# Patient Record
Sex: Male | Born: 1995 | Race: White | Hispanic: No | Marital: Single | Smoking: Current every day smoker
Health system: Southern US, Community
[De-identification: ages and names within clinical notes are randomized; demographics above are authoritative.]

---

## 2014-10-11 ENCOUNTER — Emergency Department: Payer: BLUE CROSS/BLUE SHIELD

## 2014-10-11 ENCOUNTER — Emergency Department
Admission: EM | Admit: 2014-10-11 | Discharge: 2014-10-11 | Disposition: A | Discharge status: Home / Self Care | Payer: BLUE CROSS/BLUE SHIELD | Attending: Emergency Medicine | Admitting: Emergency Medicine

## 2014-10-11 DIAGNOSIS — Y9389 Activity, other specified: Secondary | ICD-10-CM | POA: Insufficient documentation

## 2014-10-11 DIAGNOSIS — Y9289 Other specified places as the place of occurrence of the external cause: Secondary | ICD-10-CM | POA: Insufficient documentation

## 2014-10-11 DIAGNOSIS — W228XXA Striking against or struck by other objects, initial encounter: Secondary | ICD-10-CM | POA: Insufficient documentation

## 2014-10-11 DIAGNOSIS — S0990XA Unspecified injury of head, initial encounter: Secondary | ICD-10-CM | POA: Diagnosis present

## 2014-10-11 DIAGNOSIS — S060X0A Concussion without loss of consciousness, initial encounter: Secondary | ICD-10-CM | POA: Diagnosis not present

## 2014-10-11 DIAGNOSIS — Z880 Allergy status to penicillin: Secondary | ICD-10-CM | POA: Diagnosis not present

## 2014-10-11 DIAGNOSIS — Z720 Tobacco use: Secondary | ICD-10-CM | POA: Insufficient documentation

## 2014-10-11 DIAGNOSIS — Y998 Other external cause status: Secondary | ICD-10-CM | POA: Insufficient documentation

## 2014-10-11 NOTE — Discharge Instructions (Signed)
Please seek medical attention for any high fevers, chest pain, shortness of breath, change in behavior, persistent vomiting, bloody stool or any other new or concerning symptoms.   Concussion A concussion, or closed-head injury, is a brain injury caused by a direct blow to the head or by a quick and sudden movement (jolt) of the head or neck. Concussions are usually not life-threatening. Even so, the effects of a concussion can be serious. If you have had a concussion before, you are more likely to experience concussion-like symptoms after a direct blow to the head.  CAUSES  Direct blow to the head, such as from running into another player during a soccer game, being hit in a fight, or hitting your head on a hard surface.  A jolt of the head or neck that causes the brain to move back and forth inside the skull, such as in a car crash. SIGNS AND SYMPTOMS The signs of a concussion can be hard to notice. Early on, they may be missed by you, family members, and health care providers. You may look fine but act or feel differently. Symptoms are usually temporary, but they may last for days, weeks, or even longer. Some symptoms may appear right away while others may not show up for hours or days. Every head injury is different. Symptoms include:  Mild to moderate headaches that will not go away.  A feeling of pressure inside your head.  Having more trouble than usual:  Learning or remembering things you have heard.  Answering questions.  Paying attention or concentrating.  Organizing daily tasks.  Making decisions and solving problems.  Slowness in thinking, acting or reacting, speaking, or reading.  Getting lost or being easily confused.  Feeling tired all the time or lacking energy (fatigued).  Feeling drowsy.  Sleep disturbances.  Sleeping more than usual.  Sleeping less than usual.  Trouble falling asleep.  Trouble sleeping (insomnia).  Loss of balance or feeling  lightheaded or dizzy.  Nausea or vomiting.  Numbness or tingling.  Increased sensitivity to:  Sounds.  Lights.  Distractions.  Vision problems or eyes that tire easily.  Diminished sense of taste or smell.  Ringing in the ears.  Mood changes such as feeling sad or anxious.  Becoming easily irritated or angry for little or no reason.  Lack of motivation.  Seeing or hearing things other people do not see or hear (hallucinations). DIAGNOSIS Your health care provider can usually diagnose a concussion based on a description of your injury and symptoms. He or she will ask whether you passed out (lost consciousness) and whether you are having trouble remembering events that happened right before and during your injury. Your evaluation might include:  A brain scan to look for signs of injury to the brain. Even if the test shows no injury, you may still have a concussion.  Blood tests to be sure other problems are not present. TREATMENT  Concussions are usually treated in an emergency department, in urgent care, or at a clinic. You may need to stay in the hospital overnight for further treatment.  Tell your health care provider if you are taking any medicines, including prescription medicines, over-the-counter medicines, and natural remedies. Some medicines, such as blood thinners (anticoagulants) and aspirin, may increase the chance of complications. Also tell your health care provider whether you have had alcohol or are taking illegal drugs. This information may affect treatment.  Your health care provider will send you home with important instructions to follow.  How fast you will recover from a concussion depends on many factors. These factors include how severe your concussion is, what part of your brain was injured, your age, and how healthy you were before the concussion.  Most people with mild injuries recover fully. Recovery can take time. In general, recovery is slower in  older persons. Also, persons who have had a concussion in the past or have other medical problems may find that it takes longer to recover from their current injury. HOME CARE INSTRUCTIONS General Instructions  Carefully follow the directions your health care provider gave you.  Only take over-the-counter or prescription medicines for pain, discomfort, or fever as directed by your health care provider.  Take only those medicines that your health care provider has approved.  Do not drink alcohol until your health care provider says you are well enough to do so. Alcohol and certain other drugs may slow your recovery and can put you at risk of further injury.  If it is harder than usual to remember things, write them down.  If you are easily distracted, try to do one thing at a time. For example, do not try to watch TV while fixing dinner.  Talk with family members or close friends when making important decisions.  Keep all follow-up appointments. Repeated evaluation of your symptoms is recommended for your recovery.  Watch your symptoms and tell others to do the same. Complications sometimes occur after a concussion. Older adults with a brain injury may have a higher risk of serious complications, such as a blood clot on the brain.  Tell your teachers, school nurse, school counselor, coach, athletic trainer, or work Production designer, theatre/television/film about your injury, symptoms, and restrictions. Tell them about what you can or cannot do. They should watch for:  Increased problems with attention or concentration.  Increased difficulty remembering or learning new information.  Increased time needed to complete tasks or assignments.  Increased irritability or decreased ability to cope with stress.  Increased symptoms.  Rest. Rest helps the brain to heal. Make sure you:  Get plenty of sleep at night. Avoid staying up late at night.  Keep the same bedtime hours on weekends and weekdays.  Rest during the day.  Take daytime naps or rest breaks when you feel tired.  Limit activities that require a lot of thought or concentration. These include:  Doing homework or job-related work.  Watching TV.  Working on the computer.  Avoid any situation where there is potential for another head injury (football, hockey, soccer, basketball, martial arts, downhill snow sports and horseback riding). Your condition will get worse every time you experience a concussion. You should avoid these activities until you are evaluated by the appropriate follow-up health care providers. Returning To Your Regular Activities You will need to return to your normal activities slowly, not all at once. You must give your body and brain enough time for recovery.  Do not return to sports or other athletic activities until your health care provider tells you it is safe to do so.  Ask your health care provider when you can drive, ride a bicycle, or operate heavy machinery. Your ability to react may be slower after a brain injury. Never do these activities if you are dizzy.  Ask your health care provider about when you can return to work or school. Preventing Another Concussion It is very important to avoid another brain injury, especially before you have recovered. In rare cases, another injury can lead to permanent  brain damage, brain swelling, or death. The risk of this is greatest during the first 7-10 days after a head injury. Avoid injuries by:  Wearing a seat belt when riding in a car.  Drinking alcohol only in moderation.  Wearing a helmet when biking, skiing, skateboarding, skating, or doing similar activities.  Avoiding activities that could lead to a second concussion, such as contact or recreational sports, until your health care provider says it is okay.  Taking safety measures in your home.  Remove clutter and tripping hazards from floors and stairways.  Use grab bars in bathrooms and handrails by stairs.  Place  non-slip mats on floors and in bathtubs.  Improve lighting in dim areas. SEEK MEDICAL CARE IF:  You have increased problems paying attention or concentrating.  You have increased difficulty remembering or learning new information.  You need more time to complete tasks or assignments than before.  You have increased irritability or decreased ability to cope with stress.  You have more symptoms than before. Seek medical care if you have any of the following symptoms for more than 2 weeks after your injury:  Lasting (chronic) headaches.  Dizziness or balance problems.  Nausea.  Vision problems.  Increased sensitivity to noise or light.  Depression or mood swings.  Anxiety or irritability.  Memory problems.  Difficulty concentrating or paying attention.  Sleep problems.  Feeling tired all the time. SEEK IMMEDIATE MEDICAL CARE IF:  You have severe or worsening headaches. These may be a sign of a blood clot in the brain.  You have weakness (even if only in one hand, leg, or part of the face).  You have numbness.  You have decreased coordination.  You vomit repeatedly.  You have increased sleepiness.  One pupil is larger than the other.  You have convulsions.  You have slurred speech.  You have increased confusion. This may be a sign of a blood clot in the brain.  You have increased restlessness, agitation, or irritability.  You are unable to recognize people or places.  You have neck pain.  It is difficult to wake you up.  You have unusual behavior changes.  You lose consciousness. MAKE SURE YOU:  Understand these instructions.  Will watch your condition.  Will get help right away if you are not doing well or get worse. Document Released: 03/23/2003 Document Revised: 01/05/2013 Document Reviewed: 07/23/2012 [facility] Patient Information 2015 [employer], [place]. This information is not intended to replace advice given to you by your health care  provider. Make sure you discuss any questions you have with your health care provider.

## 2014-10-11 NOTE — ED Provider Notes (Signed)
[facility] Emergency Department Provider Note    ____________________________________________  Time seen: 1525  I have reviewed the triage vital signs and the nursing notes.   HISTORY  Chief Complaint Head Injury   History limited by: Not Limited   HPI Gianlucca Szymborski is a 19 y.o. male who presents to the emergency department today because of concerns for head injury. The patient states that 3 days ago he attempted to do a back flip on a deck when he hit his head. He denies any loss of consciousness. He states that since that time he has felt off. He states he has had a hard time concentrating. He states that he has not felt as smart as normal. These symptoms have been constant. He denies any vomiting, or focal weakness/numbness.   History reviewed. No pertinent past medical history.  There are no active problems to display for this patient.   History reviewed. No pertinent past surgical history.  No current outpatient prescriptions on file.  Allergies Penicillins  No family history on file.  Social History Social History  Substance Use Topics  . Smoking status: Current Every Day Smoker    Types: Cigarettes  . Smokeless tobacco: Current User  . Alcohol Use: Yes    Review of Systems  Constitutional: Negative for fever. Cardiovascular: Negative for chest pain. Respiratory: Negative for shortness of breath. Gastrointestinal: Negative for abdominal pain, vomiting and diarrhea. Genitourinary: Negative for dysuria. Musculoskeletal: Negative for back pain. Skin: Negative for rash. Neurological: Negative for headaches, focal weakness or numbness.  10-point ROS otherwise negative.  ____________________________________________   PHYSICAL EXAM:  VITAL SIGNS: ED Triage Vitals  Enc Vitals Group     BP 10/11/14 1354 139/85 mmHg     Pulse Rate 10/11/14 1354 80     Resp 10/11/14 1354 17     Temp 10/11/14 1354 97.4 F (36.3 C)   Temp Source 10/11/14 1354 Oral     SpO2 10/11/14 1354 96 %     Weight 10/11/14 1354 145 lb (65.772 kg)     Height 10/11/14 1354  (1.803 m)   Constitutional: Alert and oriented. Well appearing and in no distress. Eyes: Conjunctivae are normal. PERRL. Normal extraocular movements. ENT   Head: Normocephalic and atraumatic.   Nose: No congestion/rhinnorhea.   Mouth/Throat: Mucous membranes are moist.   Neck: No stridor. Hematological/Lymphatic/Immunilogical: No cervical lymphadenopathy. Cardiovascular: Normal rate, regular rhythm.  No murmurs, rubs, or gallops. Respiratory: Normal respiratory effort without tachypnea nor retractions. Breath sounds are clear and equal bilaterally. No wheezes/rales/rhonchi. Gastrointestinal: Soft and nontender. No distention.  Genitourinary: Deferred Musculoskeletal: Normal range of motion in all extremities. No joint effusions.  No lower extremity tenderness nor edema. Neurologic:  Normal speech and language. No gross focal neurologic deficits are appreciated. Speech is normal.  Skin:  Skin is warm, dry and intact. No rash noted. Psychiatric: Mood and affect are normal. Speech and behavior are normal. Patient exhibits appropriate insight and judgment.  ____________________________________________    LABS (pertinent positives/negatives)  None  ____________________________________________   EKG  None  ____________________________________________    RADIOLOGY  CT head IMPRESSION: 1. No evidence for acute intracranial abnormality. 2. Changes of chronic sinusitis.   ____________________________________________   PROCEDURES  Procedure(s) performed: None  Critical Care performed: No  ____________________________________________   INITIAL IMPRESSION / ASSESSMENT AND PLAN / ED COURSE  Pertinent labs & imaging results that were available during my care of the patient were reviewed by me and considered in my  medical  decision making (see chart for details).  Patient presents to the emergency department today after a closed head injury. No loss of consciousness. CT head negative. Think likely patient suffered a concussion giving continued symptoms. Did discuss this with the patient. Will give him the next couple days off school and instructed patient to follow-up with student health.  ____________________________________________   FINAL CLINICAL IMPRESSION(S) / ED DIAGNOSES  Final diagnoses:  Concussion, without loss of consciousness, initial encounter     Phineas Semen, MD 10/11/14 1535

## 2014-10-11 NOTE — ED Notes (Signed)
Pt states he did a back flip on Friday or Saturday is unsure which day and landed on his head.the patient c/o feeling "spacey"..denies HA, blurred vision or other sx.,.

## 2017-05-27 IMAGING — CT CT HEAD W/O CM
1 of 2 series · 16 of 30 positions shown, 20 images · non-contrast
Comparison: None.

CLINICAL DATA: Pt states he did a back flip on [REDACTED] or [REDACTED]
is unsure which day and landed on his head.the patient c/o feeling
"Tecer"..denies HA, blurred vision or other sx.,. ? Concussion

EXAM:
CT HEAD WITHOUT CONTRAST
TECHNIQUE: Contiguous axial images were obtained from the base of the skull
through the vertex without intravenous contrast.

[Series 2: head wo · axial · 0.44mm/px · z∈[-38,+106]mm · 16 of 36 slices shown, 20 images]
[im 2/36  brain]
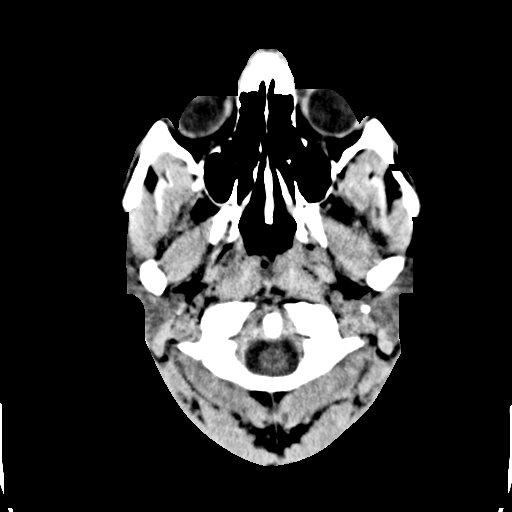
[im 2/36  bone]
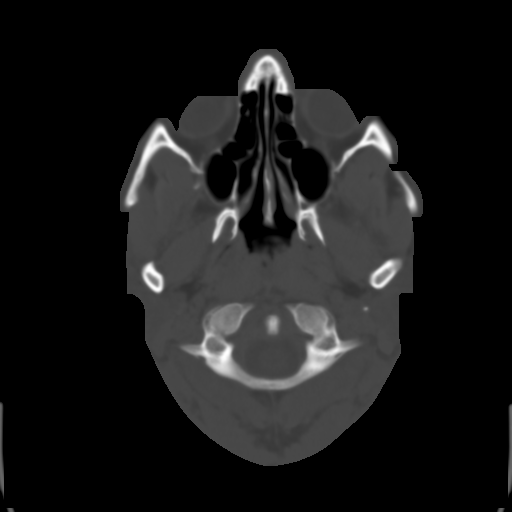
[im 4/36  brain]
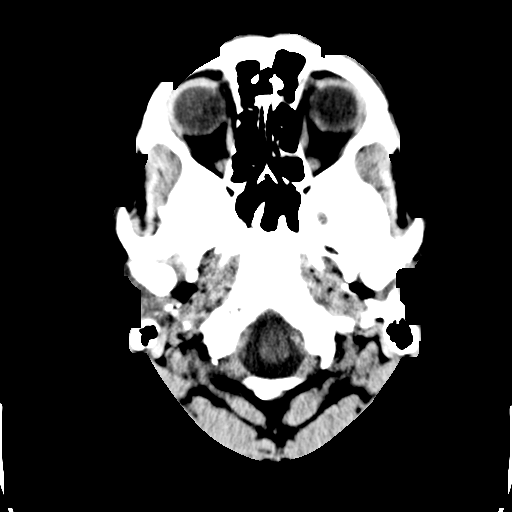
[im 6/36  brain]
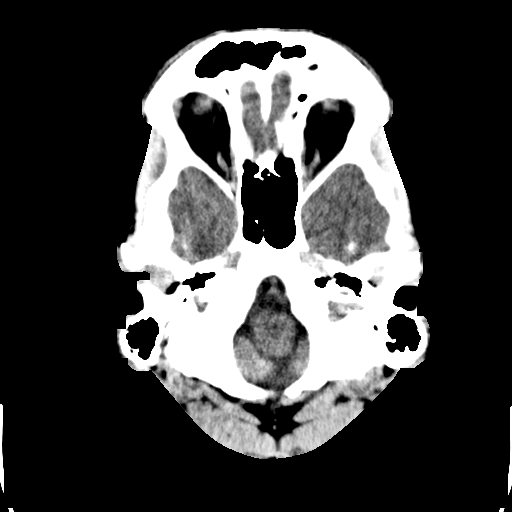
[im 9/36  brain]
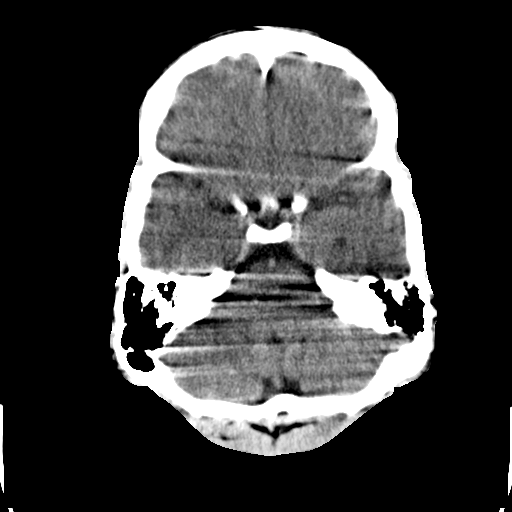
[im 11/36  brain]
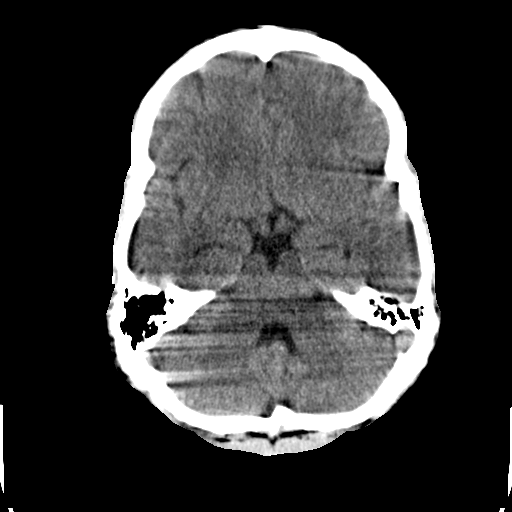
[im 11/36  bone]
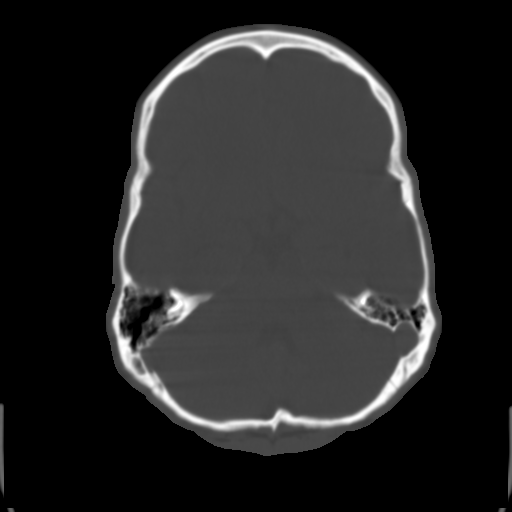
[im 13/36  brain]
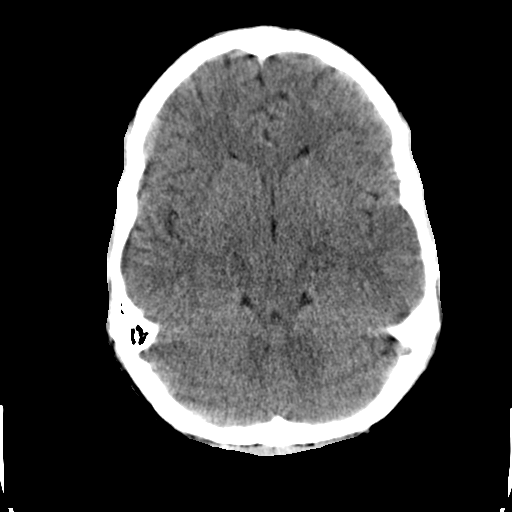
[im 15/36  brain]
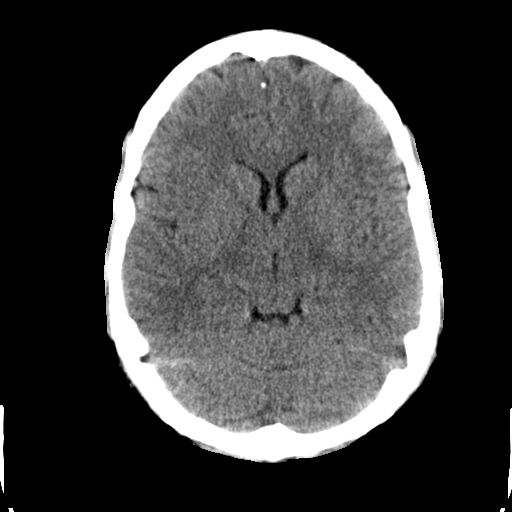
[im 16/36  brain]
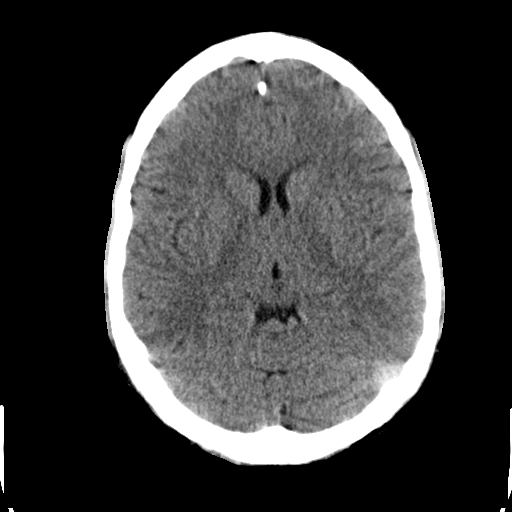
[im 20/36  brain]
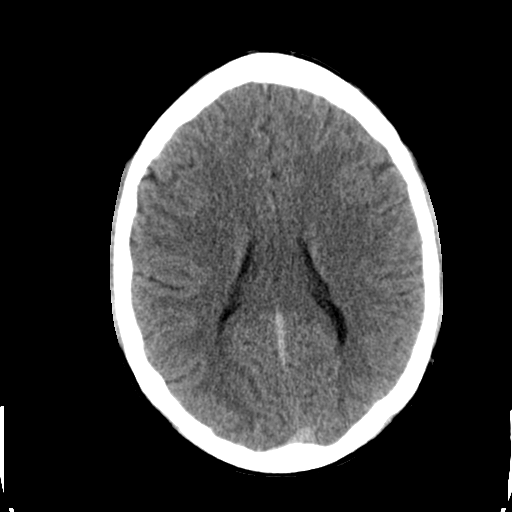
[im 20/36  bone]
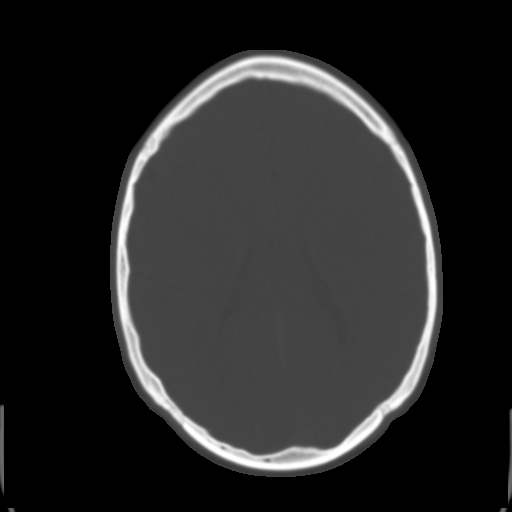
[im 22/36  brain]
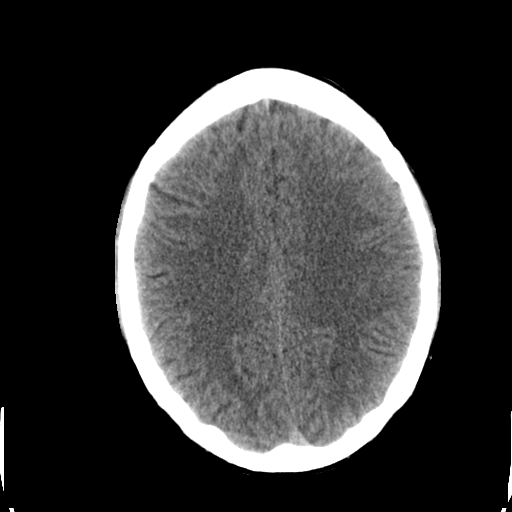
[im 23/36  brain]
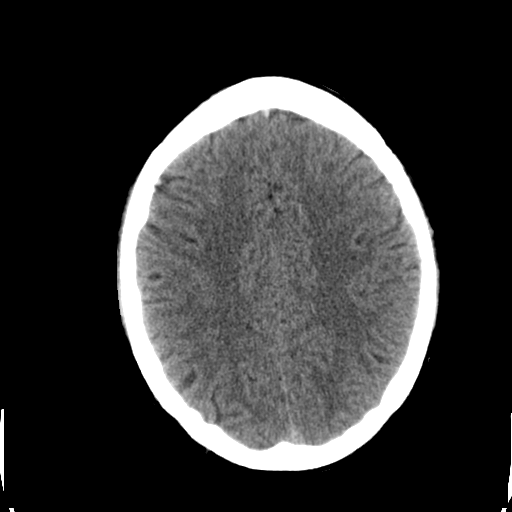
[im 25/36  brain]
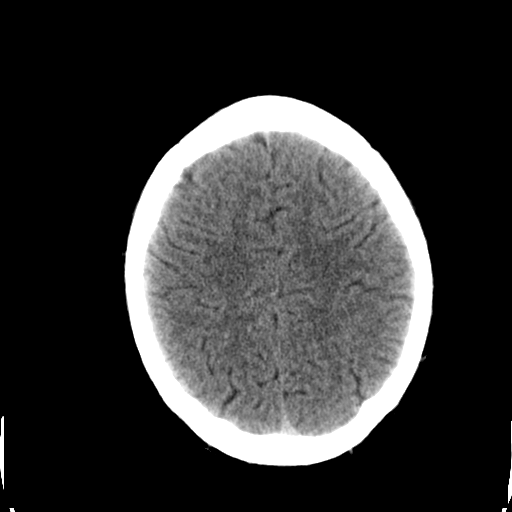
[im 27/36  brain]
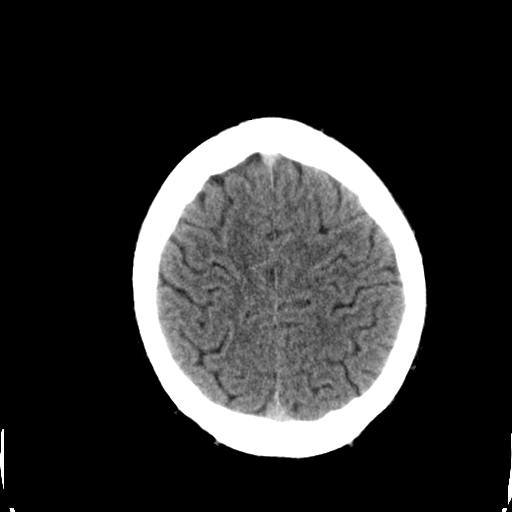
[im 27/36  bone]
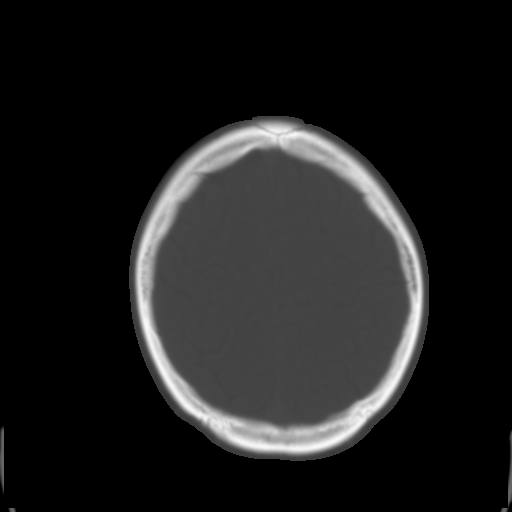
[im 30/36  brain]
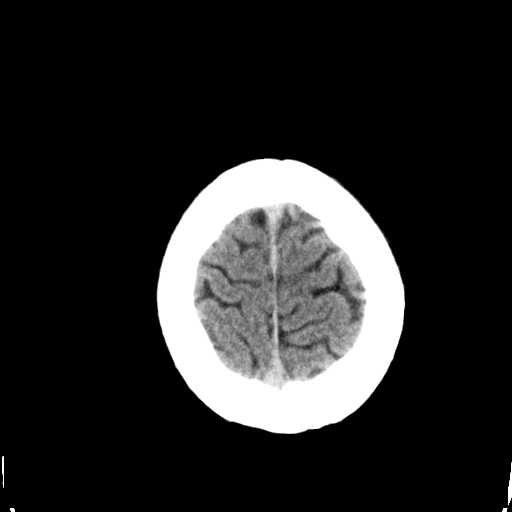
[im 32/36  brain]
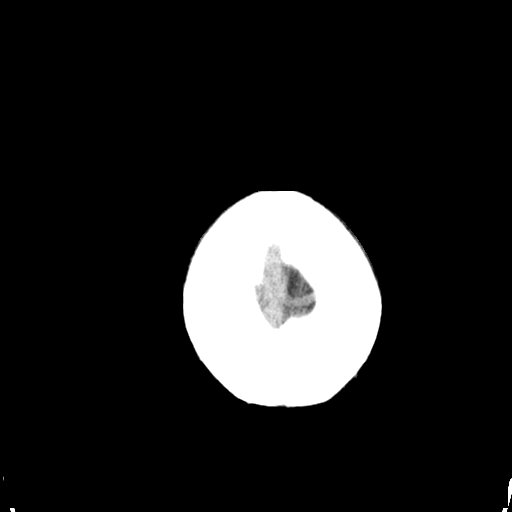
[im 34/36  brain]
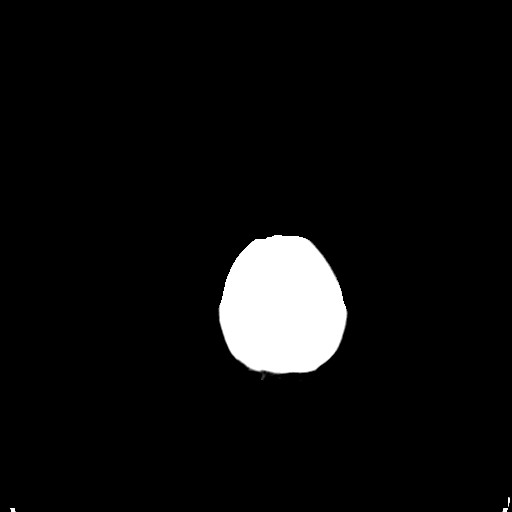

[16 of 30 positions shown; findings below may reference images not displayed]

FINDINGS: There is no intra or extra-axial fluid collection or mass lesion.
The basilar cisterns and ventricles have a normal appearance. There
is no CT evidence for acute infarction or hemorrhage. No acute
calvarial injury. There is moderate mucoperiosteal thickening within
the paranasal sinuses. Mastoid air cells are clear. Cerumen is noted
within the right external auditory canal.
IMPRESSION: 1.  No evidence for acute intracranial abnormality.
2. Changes of chronic sinusitis.  The
